# Patient Record
Sex: Male | Born: 1960 | Race: White | Hispanic: No | Marital: Married | Smoking: Former smoker
Health system: Southern US, Community
[De-identification: ages and names within clinical notes are randomized; demographics above are authoritative.]

## PROBLEM LIST (undated history)

## (undated) DIAGNOSIS — I639 Cerebral infarction, unspecified: Secondary | ICD-10-CM

## (undated) DIAGNOSIS — M3214 Glomerular disease in systemic lupus erythematosus: Secondary | ICD-10-CM

## (undated) DIAGNOSIS — F1020 Alcohol dependence, uncomplicated: Secondary | ICD-10-CM

## (undated) DIAGNOSIS — N189 Chronic kidney disease, unspecified: Secondary | ICD-10-CM

## (undated) DIAGNOSIS — E785 Hyperlipidemia, unspecified: Secondary | ICD-10-CM

## (undated) DIAGNOSIS — I10 Essential (primary) hypertension: Secondary | ICD-10-CM

## (undated) DIAGNOSIS — I82409 Acute embolism and thrombosis of unspecified deep veins of unspecified lower extremity: Secondary | ICD-10-CM

## (undated) HISTORY — DX: Glomerular disease in systemic lupus erythematosus: M32.14

## (undated) HISTORY — DX: Essential (primary) hypertension: I10

## (undated) HISTORY — PX: TOTAL HIP ARTHROPLASTY: SHX124

## (undated) HISTORY — DX: Hyperlipidemia, unspecified: E78.5

## (undated) HISTORY — DX: Cerebral infarction, unspecified: I63.9

## (undated) HISTORY — DX: Acute embolism and thrombosis of unspecified deep veins of unspecified lower extremity: I82.409

## (undated) HISTORY — DX: Alcohol dependence, uncomplicated: F10.20

## (undated) HISTORY — PX: OSTEOCHONDROMA EXCISION: SHX2137

## (undated) HISTORY — PX: RENAL BIOPSY: SHX156

## (undated) HISTORY — DX: Chronic kidney disease, unspecified: N18.9

---

## 2005-05-27 ENCOUNTER — Inpatient Hospital Stay (HOSPITAL_COMMUNITY): Admission: RE | Admit: 2005-05-27 | Discharge: 2005-05-29 | Payer: Self-pay | Admitting: Psychiatry

## 2005-05-28 ENCOUNTER — Ambulatory Visit: Payer: Self-pay | Admitting: Psychiatry

## 2014-12-14 ENCOUNTER — Other Ambulatory Visit: Payer: Self-pay | Admitting: Nephrology

## 2014-12-14 DIAGNOSIS — I639 Cerebral infarction, unspecified: Secondary | ICD-10-CM

## 2014-12-19 ENCOUNTER — Ambulatory Visit
Admission: RE | Admit: 2014-12-19 | Discharge: 2014-12-19 | Disposition: A | Discharge status: Home / Self Care | Payer: 59 | Source: Ambulatory Visit | Attending: Nephrology | Admitting: Nephrology

## 2014-12-19 DIAGNOSIS — I639 Cerebral infarction, unspecified: Secondary | ICD-10-CM

## 2014-12-26 ENCOUNTER — Ambulatory Visit: Payer: 59 | Admitting: Neurology

## 2015-03-21 ENCOUNTER — Ambulatory Visit (INDEPENDENT_AMBULATORY_CARE_PROVIDER_SITE_OTHER): Payer: 59 | Admitting: Sports Medicine

## 2015-03-21 ENCOUNTER — Encounter: Payer: Self-pay | Admitting: Sports Medicine

## 2015-03-21 VITALS — BP 125/82 | HR 89 | Resp 16

## 2015-03-21 DIAGNOSIS — M79674 Pain in right toe(s): Secondary | ICD-10-CM | POA: Diagnosis not present

## 2015-03-21 DIAGNOSIS — L600 Ingrowing nail: Secondary | ICD-10-CM

## 2015-03-21 NOTE — Progress Notes (Deleted)
   Subjective:    Patient ID: Russell Graham, male    DOB: [birth year], 54 y.o.   MRN: [id number]  HPI    Review of Systems  Constitutional: Positive for fatigue.  Cardiovascular:       Blood clots legs /lungs High / low BP  HX og heart attack/ stroke : June 2016   Genitourinary:       Kidney disease  Lupus   Musculoskeletal:       Swelling of legs and feet  Neurological:       Memory loss  Hematological:       Blood clotting disorder  Phlebitis   All other systems reviewed and are negative.      Objective:   Physical Exam        Assessment & Plan:

## 2015-03-21 NOTE — Progress Notes (Signed)
Patient ID: Russell Graham, male   DOB: 02-Nov-1960, 54 y.o.   MRN: [id number]  HPI: Russell Graham is a 54 y.o.  male patient who presents to office today complaining of a painful incurvated, red, hot, swollen medial nail border of the big toe on the right foot. This has been present for many months; multiple attempts at cutting it out and soaking with reocurence.  Patient denies fever/chills/nausea/vomitting/any other related constitutional symptoms at this time.  Patient has a history significant for: lupus, HTN, HLD, CVA 4 months ago with residual muscle fatigue in legs.   No current outpatient prescriptions on file prior to visit.   No current facility-administered medications on file prior to visit.   Allergies  Allergen Reactions  . Naproxen Hives    Review of Systems  Constitutional: Positive for fatigue.  Cardiovascular:       Blood clots legs /lungs High / low BP  HX og heart attack/ stroke : June 2016   Genitourinary:       Kidney disease  Lupus   Musculoskeletal:       Swelling of legs and feet  Neurological:       Memory loss  Hematological:       Blood clotting disorder  Phlebitis   All other systems reviewed and are negative.  Objective:  Vitals: Reviewed  General: Well developed, nourished, in no acute distress, alert and oriented x3   Dermatology: Skin is warm, dry and supple bilateral. Right hallux medial border of nail appears to be  severely incurvated with hyperkeratosis formation at the distal aspects of  the medial and lateral nail border. (+) Erythema. (+) Edema. (-) serosanguous  drainage present. The remaining nails appear unremarkable at this time. There are no open sores, lesions or other signs of infection present.  Vascular: Dorsalis Pedis artery and Posterior Tibial artery pedal pulses are 2/4 bilateral with immedate capillary fill time. Scant Pedal hair growth present. No lower extremity edema.   Neruologic: Grossly intact via light touch  bilateral.  Musculoskeletal: Tenderness to palpation of the Right hallux medial nail fold. Muscular strength within normal limits in all groups bilateral.   Assesement and Plan: Problem List Items Addressed This Visit    None    Visit Diagnoses    Nail, ingrown    -  Primary    right hallux medial border    Pain in toe of right foot          -Discussed treatment alternatives and plan of care; Explained permanent/temporary nail avulsion and post procedure course to patient. Made patient aware that because of lupus and use of immunosuppressants may influence healing after procedure; patient expressed understanding and desires permanent removal. - After a verbal consent for permanent right hallux medial border avulsion, injected 3 ml of a 50:50 mixture of 1% plain lidocaine and 0.25% plain marcaine in a normal hallux block fashion. Next, a  betadine prep was performed. Anesthesia was tested and found to be appropriate.  The offending right hallux medial nail border was then incised from the hyponychium to the epinychium. The offending nail border was removed and cleared from the field. The area was curretted for any remaining nail or spicules. Phenol application performed and the area was then flushed with alcohol and dressed with silvadene cream and a dry sterile dressing. -Patient was instructed to leave the dressing intact for today and begin soaking  in a weak solution of betadine and water tomorrow. Patient was instructed to  soak for 15-20 minutes each day and apply neosporin and a gauze or bandaid dressing each day. -Patient was instrcuted to monitor the toe for signs of infection and return to office if toe becomes red, hot or swollen. -Patient is to return in 1 week for follow up care or sooner if problems arise.  Asencion Islam, DPM

## 2015-03-21 NOTE — Patient Instructions (Signed)

## 2015-03-28 ENCOUNTER — Ambulatory Visit: Payer: 59 | Admitting: Sports Medicine

## 2015-04-03 ENCOUNTER — Ambulatory Visit (INDEPENDENT_AMBULATORY_CARE_PROVIDER_SITE_OTHER): Payer: 59 | Admitting: Sports Medicine

## 2015-04-03 ENCOUNTER — Encounter: Payer: Self-pay | Admitting: Sports Medicine

## 2015-04-03 VITALS — BP 125/83 | HR 90 | Resp 14

## 2015-04-03 DIAGNOSIS — L600 Ingrowing nail: Secondary | ICD-10-CM

## 2015-04-03 DIAGNOSIS — M79674 Pain in right toe(s): Secondary | ICD-10-CM

## 2015-04-03 NOTE — Progress Notes (Signed)
Patient ID: Russell Graham, male   DOB: May 11, 1961, 54 y.o.   MRN: [id number] Subjective: Russell SermonsJuan M Graham is a 54 y.o.  male patient returns to office today for follow up evaluation after having Right Hallux medial permanent nail avulsion performed on (03/21/15). Patient has been soaking using betadine and applying topical antibiotic covered with bandaid daily. Patient denies fever/chills/nausea/vomitting/any other related constitutional symptoms at this time.  Objective:  Vitals: Reviewed  General: Well developed, nourished, in no acute distress, alert and oriented x3   Dermatology: Skin is warm, dry and supple bilateral. Right hallux medial nail bed appears to be clean, dry, with mild granular tissue and surrounding eschar/scab. (+) Mild focal Erythema. (-) Edema. (-) serosanguous drainage present. The remaining nails appear unremarkable at this time. There are no other lesions or other signs of infection  present.  Neurovascular status: Intact. No lower extremity swelling; No pain with calf compression bilateral.  Musculoskeletal: Decreased tenderness to palpation of the Right Hallux medial nail fold. Muscular strength within normal limits bilateral.   Assesement and Plan: Problem List Items Addressed This Visit    None    Visit Diagnoses    Nail, ingrown    -  Primary    Status post PNA Right Hallux Medial Nail border (03/21/15), healing well    Pain in toe of right foot          -Examined patient  -Cleansed right hallux medial nail fold and gently scrubbed with peroxide ridding away eschar at site and applied triple antibiotic covered with bandaid.  -Discussed plan of care with patient. -Patient to now begin soaking in a weak solution of Epsom salt and warm water. Patient was instructed to soak for 15-20 minutes each day until the toe appears normal and there is no drainage, redness, tenderness, or swelling at the procedure site, and apply neosporin and a gauze or bandaid dressing each day  as needed. May leave open to air at night. -Educated patient on long term care after nail surgery. -Patient was instructed to monitor the toe for reoccurrence and signs of infection; Patient advised to return to office if toe becomes red, hot or swollen. -Patient is to return in as needed or sooner if problems arise.  Asencion Islamitorya Maxson Oddo, DPM

## 2015-07-31 ENCOUNTER — Encounter: Payer: Self-pay | Admitting: Gastroenterology

## 2015-08-20 DIAGNOSIS — M329 Systemic lupus erythematosus, unspecified: Secondary | ICD-10-CM | POA: Diagnosis not present

## 2015-08-20 DIAGNOSIS — D696 Thrombocytopenia, unspecified: Secondary | ICD-10-CM | POA: Diagnosis not present

## 2015-09-19 ENCOUNTER — Telehealth: Payer: Self-pay | Admitting: Gastroenterology

## 2015-09-19 ENCOUNTER — Ambulatory Visit (INDEPENDENT_AMBULATORY_CARE_PROVIDER_SITE_OTHER): Payer: BC Managed Care – PPO | Admitting: Gastroenterology

## 2015-09-19 ENCOUNTER — Encounter: Payer: Self-pay | Admitting: Gastroenterology

## 2015-09-19 VITALS — BP 116/64 | HR 76 | Ht 70.0 in | Wt 188.2 lb

## 2015-09-19 DIAGNOSIS — F1020 Alcohol dependence, uncomplicated: Secondary | ICD-10-CM

## 2015-09-19 DIAGNOSIS — Z1211 Encounter for screening for malignant neoplasm of colon: Secondary | ICD-10-CM

## 2015-09-19 DIAGNOSIS — D696 Thrombocytopenia, unspecified: Secondary | ICD-10-CM

## 2015-09-19 DIAGNOSIS — M329 Systemic lupus erythematosus, unspecified: Secondary | ICD-10-CM | POA: Insufficient documentation

## 2015-09-19 DIAGNOSIS — Z79899 Other long term (current) drug therapy: Secondary | ICD-10-CM | POA: Diagnosis not present

## 2015-09-19 DIAGNOSIS — I639 Cerebral infarction, unspecified: Secondary | ICD-10-CM | POA: Insufficient documentation

## 2015-09-19 DIAGNOSIS — R0989 Other specified symptoms and signs involving the circulatory and respiratory systems: Secondary | ICD-10-CM

## 2015-09-19 DIAGNOSIS — R198 Other specified symptoms and signs involving the digestive system and abdomen: Secondary | ICD-10-CM | POA: Diagnosis not present

## 2015-09-19 DIAGNOSIS — R6889 Other general symptoms and signs: Secondary | ICD-10-CM

## 2015-09-19 NOTE — Progress Notes (Signed)
HPI :  55 y/o male seen in consultation for Dr. Alysia Penna for [facility] screening. 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 He has a history of CVAs - he reports 3 total, the first he thinks in December 2015, then again in June and in November 2016. He has not had any GI bleeding since being on this. He has had some bleeding at his gums in the past.   He thinks he has had a prior colonoscopy in the early 1990s. Nothing since that time. He denies any blood in the stools routinely. No constipation or diarrhea. No abdominal pain. Eating well. No weight loss. Trying to lose wieght with dietary changes. No FH of colon cancer. 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 He thinks his last drink was about a month ago, he has had a few relapses in recent months and attending AA again. He was noted to have thrombocytopenia on his last blood draw. He denies a history of liver disease or cirrhosis. He has a history of CKD with SLE.   He additionally reports a sense of needing to clear his throat frequently, he states this comes and goes over the past year. He denies any heartburn. He denies regurgitation. No dysphagia. He denies any postnasal drip.   Labs from 05/20/15 reviewed: WBC 4.3, Hgb 13.0, MCV 105, platelets 101 Cr 1.09  AST 28, ALT 28, AP 57, T bil 0.5    Past Medical History  Diagnosis Date  . CVA (cerebral infarction)   . Hypertension   . Hyperlipemia   . Lupus nephritis (HCC)   . DVT (deep venous thrombosis) (HCC)   . Chronic kidney disease   . Alcoholism [facility])      Past Surgical History  Procedure Laterality Date  . Total hip arthroplasty Bilateral   . Osteochondroma excision      right arm  . Renal biopsy     Family History  Problem Relation Age of Onset  . Heart disease Brother   . Breast cancer Sister   . Hypertension  Sister   . Colon cancer Neg Hx   . Stomach cancer Neg Hx   . Pancreatic cancer Neg Hx   . Esophageal cancer Neg Hx    Social History  Substance Use Topics  . Smoking status: Former Games developer  . Smokeless tobacco: Never Used  . Alcohol Use: No   Current Outpatient Prescriptions  Medication Sig Dispense Refill  . carvedilol (COREG) 6.25 MG tablet Take 6.25 mg by mouth 2 (two) times daily with a meal.    . ELIQUIS 5 MG TABS tablet TK 1 T PO BID  4  . hydroxychloroquine (PLAQUENIL) 200 MG tablet Take by mouth. Reported on 09/19/2015    . predniSONE (DELTASONE) 10 MG tablet Take 1 tablet every other day alternating with 0.5 tablet  3  . simvastatin (ZOCOR) 20 MG tablet   11   No current facility-administered medications for this visit.   Allergies  Allergen Reactions  . Naproxen Hives     Review of Systems: All systems reviewed and negative except where noted in HPI.    Labs per HPI as above  Physical Exam: BP 116/64 mmHg  Pulse 76  Ht  (1.778 m)  Wt 188 lb 3.2 oz (85.367 kg)  BMI 27.00 kg/m2 Constitutional: Pleasant,well-developed, male in no  acute distress. HEENT: Normocephalic and atraumatic. Conjunctivae are normal. No scleral icterus. Neck supple.  Cardiovascular: Normal rate, regular rhythm.  Pulmonary/chest: Effort normal and breath sounds normal. No wheezing, rales or rhonchi. Abdominal: Soft, nondistended, nontender. No appreciable ascites. Bowel sounds active throughout. There are no masses palpable. No hepatomegaly. Extremities: no edema Lymphadenopathy: No cervical adenopathy noted. Neurological: Alert and oriented to person place and time. Skin: Skin is warm and dry. No rashes noted. Psychiatric: Normal mood and affect. Behavior is normal.   ASSESSMENT AND PLAN: 55 y/o male with a history of 3 CVAs since 2015, on Eliquis, with history of SLE, here for CRC screening. Also noted to have thrombocytopenia in the setting of a history of significant alcohol  use raising the possibility of underlying cirrhosis.   CRC screening - he is average risk without symptoms. I suspect his mild macrocytic anemia from December is related to alcohol use but have requested his more recent CBC. I discussed screening options with him to include optical colonoscopy and stool testing. Given his multiple CVAs and on Eliquis, he would be at higher risk for CVA recurrence if Eliquis is held. In this light, we will proceed with Cologuard testing. If negative, will repeat in 3 years. If positive, we will need to discuss his case with Neurology to see if he has clearance to hold Eliquis for colonoscopy.   Thrombocytopenia / Alcoholism - abstinent for one month per patient. Given his low platelets it raises the possibility of underlying cirrhosis. He had a repeat CBC last week for this issue and we will obtain the results. If thrombocytopenia persists I will recommend abdominal US to assess for cirrhotic changes. His LFTs were most recently normal and he will continue to work on abstinence, attending AA.  Throat clearing - does not seem consistent with reflux at this time. We could consider empiric PPI pending his course, but he will have this further evaluated by primary care, rule out postnasal drip, etc.   Ileene PatrickSteven Armbruster, MD [facility] Pager [phone]  CC: Alysia PennaHolwerda, Scott, MD

## 2015-09-19 NOTE — Patient Instructions (Signed)
We have sent your demographic and insurance information to [facility]. They should contact you within the next week regarding your Cologuard (colon cancer screening) test. If you have not heard from them within the next week, please call our office at [phone]. 

## 2015-09-19 NOTE — Telephone Encounter (Signed)
Russell KocherRegina this patient's recent labs were reviewed from 09/09/2015 - he had a CBC showing Hgb of 13.1, with MCV of 104 and platelets of 101. LFTS normal Given his history of alcohol use and chronic thrombocytopenia, I recommend a US abdomen to assess for cirrhosis.   Can you please help schedule him for this US? Thanks

## 2015-09-20 ENCOUNTER — Other Ambulatory Visit: Payer: Self-pay | Admitting: *Deleted

## 2015-09-20 DIAGNOSIS — D696 Thrombocytopenia, unspecified: Secondary | ICD-10-CM

## 2015-09-20 DIAGNOSIS — F1010 Alcohol abuse, uncomplicated: Secondary | ICD-10-CM

## 2015-09-20 NOTE — Telephone Encounter (Signed)
Scheduled [facility] at [facility] on 09/26/15 at 11:00 AM. NPO after midnight. Left a message for patient to call back.

## 2015-09-20 NOTE — Telephone Encounter (Signed)
Patient notified of results and recommendations. Patient given appointment date, time and instructions.

## 2015-09-26 ENCOUNTER — Telehealth: Payer: Self-pay | Admitting: *Deleted

## 2015-09-26 ENCOUNTER — Other Ambulatory Visit: Payer: Self-pay | Admitting: *Deleted

## 2015-09-26 ENCOUNTER — Ambulatory Visit (HOSPITAL_COMMUNITY)
Admission: RE | Admit: 2015-09-26 | Discharge: 2015-09-26 | Disposition: A | Discharge status: Home / Self Care | Payer: BC Managed Care – PPO | Source: Ambulatory Visit | Attending: Gastroenterology | Admitting: Gastroenterology

## 2015-09-26 DIAGNOSIS — F1010 Alcohol abuse, uncomplicated: Secondary | ICD-10-CM | POA: Diagnosis present

## 2015-09-26 DIAGNOSIS — D696 Thrombocytopenia, unspecified: Secondary | ICD-10-CM | POA: Diagnosis present

## 2015-09-26 DIAGNOSIS — R932 Abnormal findings on diagnostic imaging of liver and biliary tract: Secondary | ICD-10-CM

## 2015-09-26 NOTE — Telephone Encounter (Signed)
Received a call from [facility]ology that patient had an ultrasound today. Has possible liver mass and needs to have an MRI.

## 2015-09-26 NOTE — Telephone Encounter (Signed)
Thanks for letting me know, I just reviewed it now. Please tell him there is a small abnormality on the US - they are not sure if this is a mass like lesion or just an area of fatty sparing. To clarify what it is they are recommending an MRI of the liver with and without contrast. Can you help him to coordinate this as soon as possible. This will be a more definitive study. If he has additional questions please let me know. Thanks

## 2015-09-26 NOTE — Telephone Encounter (Signed)
Scheduled MRI of liver at [facility] on 10/04/15 at 4:00 PM. NPO 6 hours prior.(10:00 AM) Patient given results and recommendations. He was given appointment date, time and instructions.

## 2015-10-04 ENCOUNTER — Other Ambulatory Visit: Payer: Self-pay | Admitting: Gastroenterology

## 2015-10-04 ENCOUNTER — Telehealth: Payer: Self-pay | Admitting: *Deleted

## 2015-10-04 ENCOUNTER — Ambulatory Visit (HOSPITAL_COMMUNITY)
Admission: RE | Admit: 2015-10-04 | Discharge: 2015-10-04 | Disposition: A | Discharge status: Home / Self Care | Payer: BC Managed Care – PPO | Source: Ambulatory Visit | Attending: Gastroenterology | Admitting: Gastroenterology

## 2015-10-04 DIAGNOSIS — R9389 Abnormal findings on diagnostic imaging of other specified body structures: Secondary | ICD-10-CM

## 2015-10-04 DIAGNOSIS — R932 Abnormal findings on diagnostic imaging of liver and biliary tract: Secondary | ICD-10-CM

## 2015-10-04 NOTE — Telephone Encounter (Signed)
Patient was unable to complete MRI due to claustrophobia. He will do it if it is a bigger MRI machine and with something to calm down. [facility]. Please, advise on medication for nerves.

## 2015-10-06 NOTE — Telephone Encounter (Signed)
We can try an open MRI if that is a possibility, if not, we can give him some valium 5mg  po x 1 prior to the MRI. Thanks for coordinating

## 2015-10-07 MED ORDER — DIAZEPAM 5 MG PO TABS: ORAL_TABLET | ORAL | Status: AC

## 2015-10-07 NOTE — Telephone Encounter (Signed)
Spoke with [facility] Inetta Fermo(Tina) and scheduled patient on 10/09/15 at 9:15 AM with 8:45 AM arrival. [address] [place], [place] [address]. Faxed order to [phone]

## 2015-10-07 NOTE — Telephone Encounter (Signed)
Rx called to pharmacy for Valium. Patient notified of appointment date, time and instructions.

## 2015-10-15 ENCOUNTER — Encounter: Payer: Self-pay | Admitting: Gastroenterology

## 2015-10-15 ENCOUNTER — Other Ambulatory Visit: Payer: Self-pay

## 2015-10-15 LAB — COLOGUARD: COLOGUARD: NEGATIVE

## 2015-10-16 ENCOUNTER — Telehealth: Payer: Self-pay | Admitting: Gastroenterology

## 2015-10-16 NOTE — Telephone Encounter (Signed)
Rene KocherRegina can you please notify this patient that his MRI liver from 10/09/15 does NOT show any liver mass. The liver appears normal without mass lesions or cirrhotic changes. He has some benign appearing renal cysts, one is hemorrhagic but appears benign. The liver and organs otherwise appear unremarkable, I don't see any evidence of cirrhosis. His primary care can follow up the kidney cyst but these appear benign and likely no further evaluation is needed.  This report will be scanned into Epic

## 2015-10-17 NOTE — Telephone Encounter (Signed)
Left a message for patient to call back for results

## 2015-10-17 NOTE — Telephone Encounter (Signed)
Patient given results and recommendations. 

## 2016-04-09 ENCOUNTER — Ambulatory Visit
Admission: RE | Admit: 2016-04-09 | Discharge: 2016-04-09 | Disposition: A | Discharge status: Home / Self Care | Payer: BC Managed Care – PPO | Source: Ambulatory Visit | Attending: Physician Assistant | Admitting: Physician Assistant

## 2016-04-09 ENCOUNTER — Other Ambulatory Visit: Payer: Self-pay | Admitting: Physician Assistant

## 2016-04-09 DIAGNOSIS — R531 Weakness: Secondary | ICD-10-CM

## 2016-09-22 IMAGING — US US ABDOMEN COMPLETE
1 series · 13 of 25 positions shown · non-contrast
Comparison: None.

CLINICAL DATA: Patient with history of alcohol abuse. Chronic
thrombocytopenia.

EXAM:
ABDOMEN ULTRASOUND COMPLETE

[Series 1: us abdomen complete · 0.23mm/px · 13 of 151 slices shown]
[im 1/151]
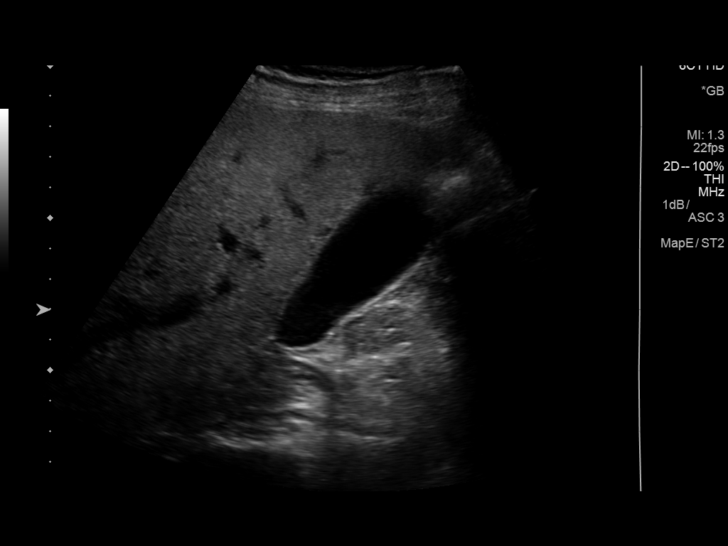
[im 13/151]
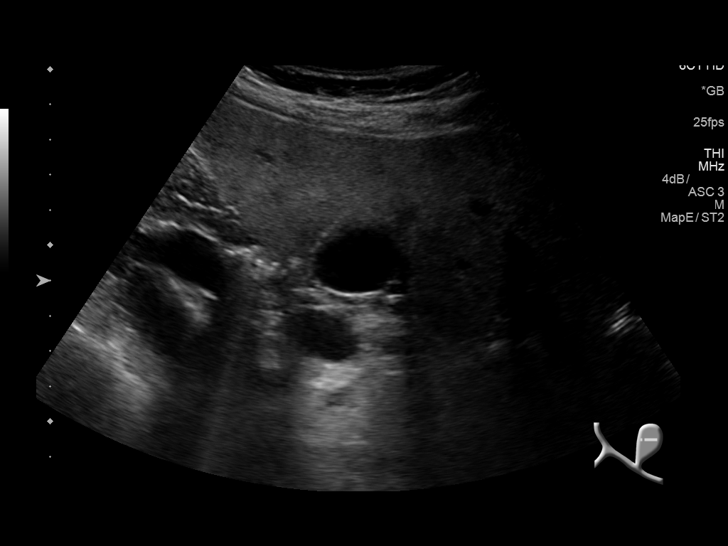
[im 26/151]
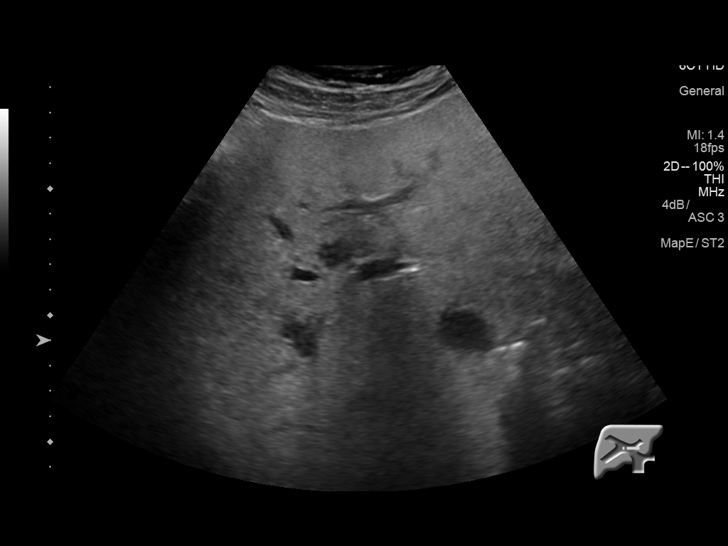
[im 38/151]
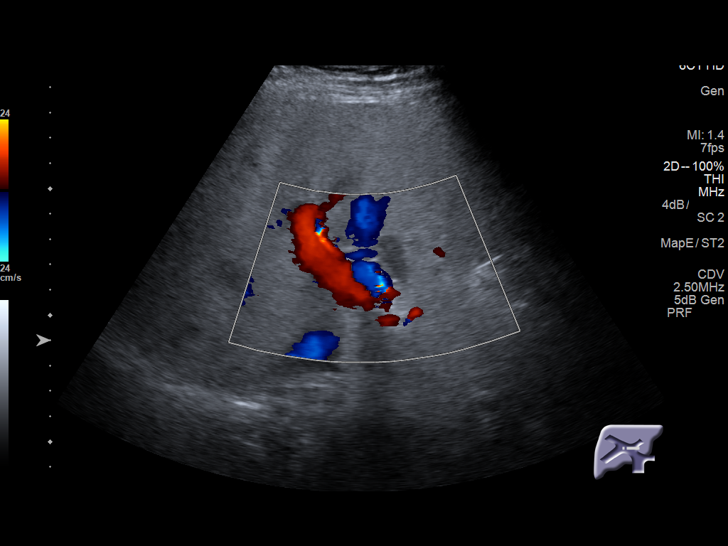
[im 51/151]
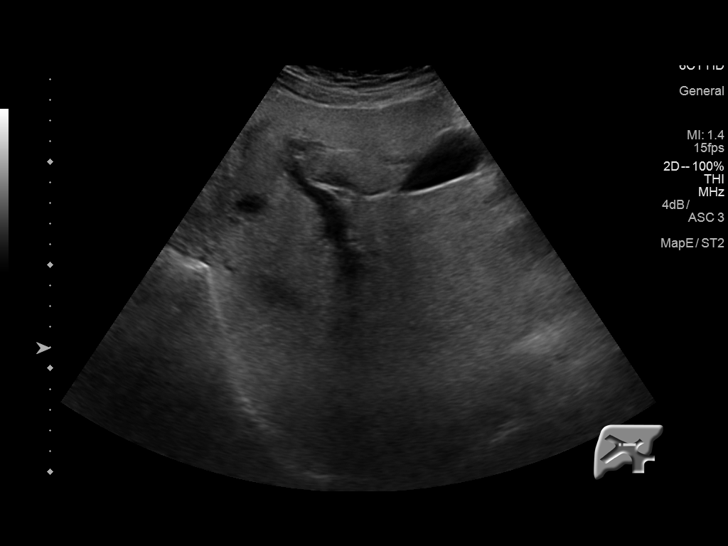
[im 63/151]
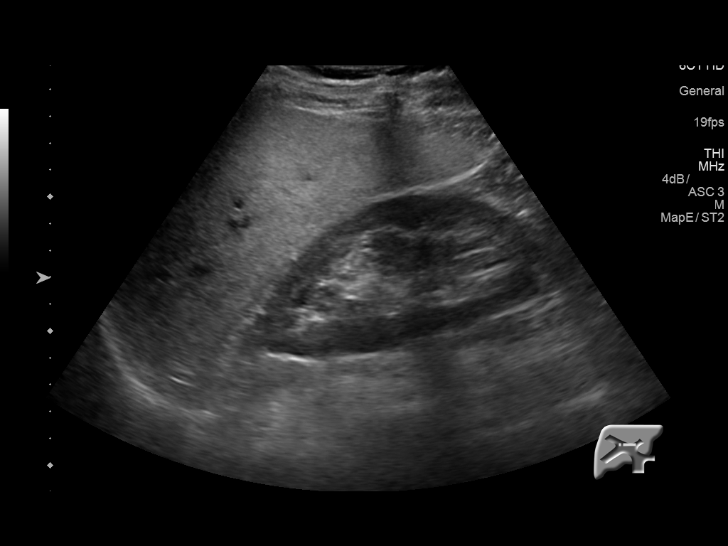
[im 76/151]
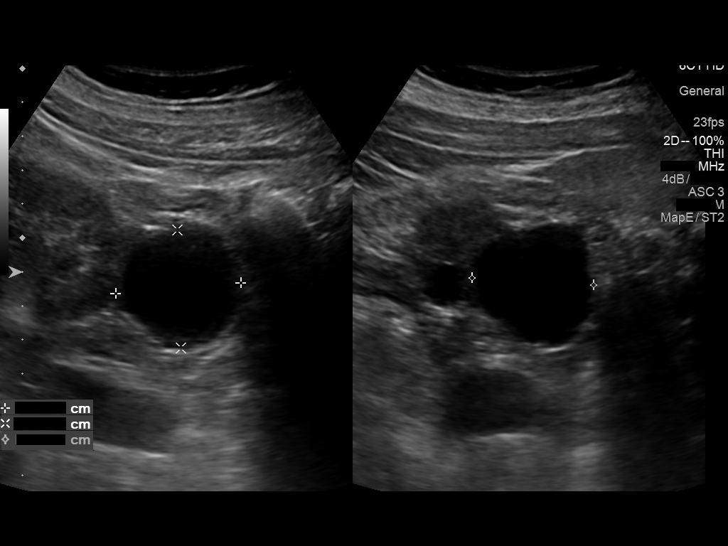
[im 88/151]
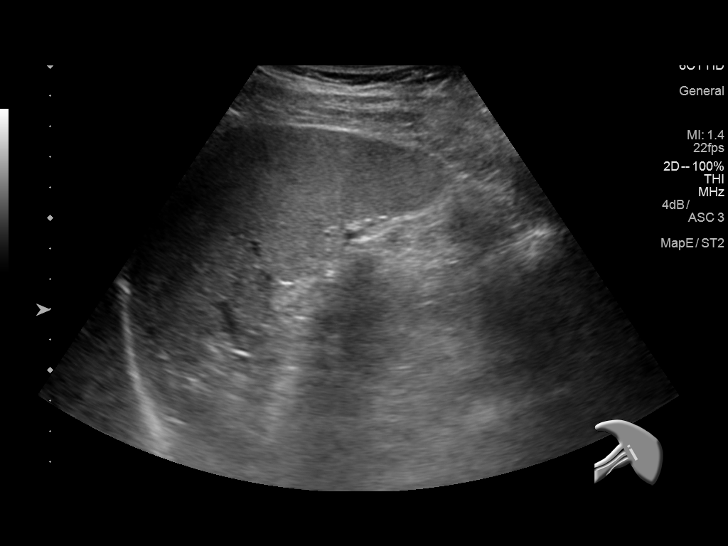
[im 101/151]
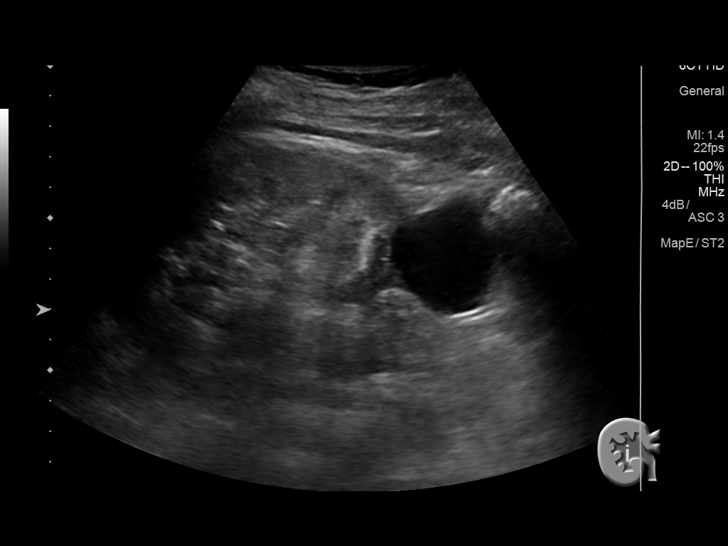
[im 113/151]
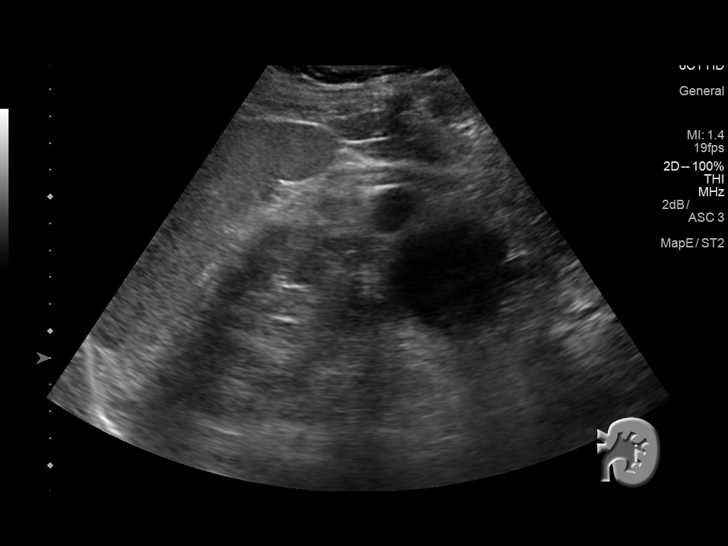
[im 126/151]
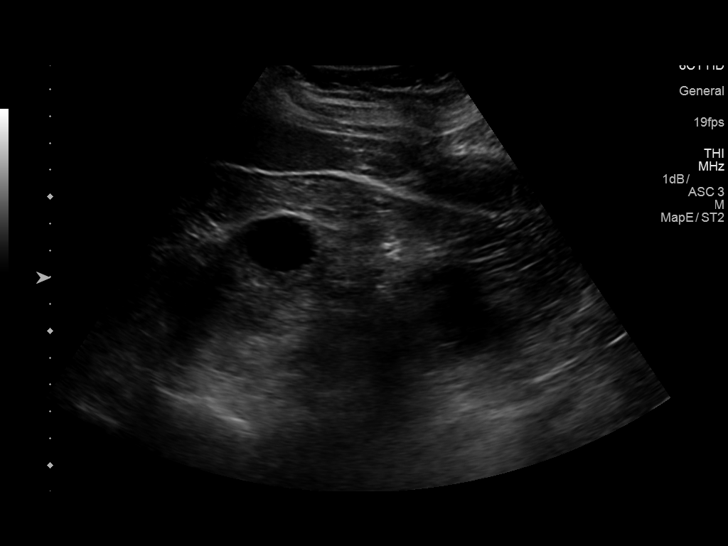
[im 138/151]
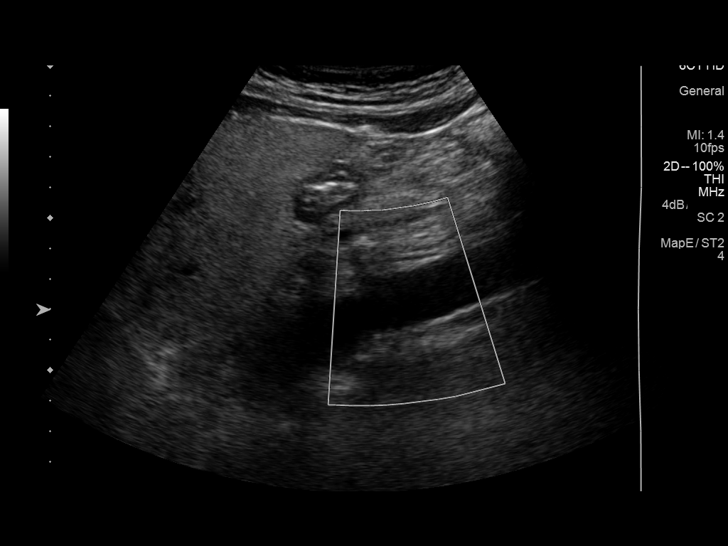
[im 151/151]
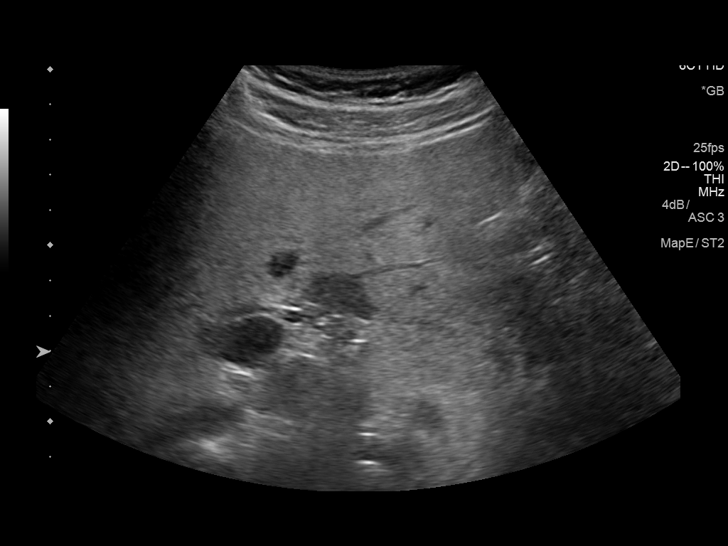

[13 of 25 positions shown; findings below may reference images not displayed]

FINDINGS: Gallbladder: No gallstones or wall thickening visualized. No
sonographic Murphy sign noted by sonographer.

Common bile duct: Diameter: 2 mm

Liver: The liver is diffusely increased in echogenicity. Adjacent to
the main portal vein there is a 2.6 x 1.0 x 2.2 cm masslike area of
decreased echogenicity.

IVC: No abnormality visualized.

Pancreas: Visualized portion unremarkable.

Spleen: Size and appearance within normal limits.

Right Kidney: Length: 12.5 cm. Normal renal cortical thickness and
echogenicity. No hydronephrosis. Multiple renal cysts are
demonstrated.

Left Kidney: Length: 11.2 cm. Normal renal cortical thickness and
echogenicity. No hydronephrosis. Multiple renal cysts are
demonstrated.

Abdominal aorta: No aneurysm visualized.

Other findings: None.
IMPRESSION: Increased hepatic parenchymal echogenicity as can be seen with
steatosis. There is a 2.6 cm focal area of hypo echogenicity within
the central liver which may represent a true hepatic mass or
potentially an area of focal fatty sparing. This needs dedicated
evaluation with pre and post contrast-enhanced abdominal MRI for
definitive characterization.

No cholelithiasis or sonographic evidence for acute cholecystitis.

These results will be called to the ordering clinician or
representative by the Radiologist Assistant, and communication
documented in the PACS or zVision Dashboard.

## 2017-03-25 ENCOUNTER — Other Ambulatory Visit: Payer: Self-pay | Admitting: Family Medicine

## 2017-03-25 DIAGNOSIS — R911 Solitary pulmonary nodule: Secondary | ICD-10-CM

## 2017-05-28 ENCOUNTER — Ambulatory Visit
Admission: RE | Admit: 2017-05-28 | Discharge: 2017-05-28 | Disposition: A | Discharge status: Home / Self Care | Payer: BC Managed Care – PPO | Source: Ambulatory Visit | Attending: Family Medicine | Admitting: Family Medicine

## 2017-05-28 DIAGNOSIS — R911 Solitary pulmonary nodule: Secondary | ICD-10-CM

## 2017-05-28 MED ORDER — IOPAMIDOL (ISOVUE-300) INJECTION 61%
75.0000 mL | Freq: Once | INTRAVENOUS | Status: AC | PRN
  Administered 2017-05-28: 75 mL via INTRAVENOUS

## 2017-12-31 ENCOUNTER — Ambulatory Visit
Admission: RE | Admit: 2017-12-31 | Discharge: 2017-12-31 | Disposition: A | Discharge status: Home / Self Care | Payer: BC Managed Care – PPO | Source: Ambulatory Visit | Attending: Family Medicine | Admitting: Family Medicine

## 2017-12-31 ENCOUNTER — Other Ambulatory Visit: Payer: Self-pay | Admitting: Family Medicine

## 2017-12-31 DIAGNOSIS — M5489 Other dorsalgia: Secondary | ICD-10-CM

## 2019-01-06 ENCOUNTER — Telehealth: Payer: Self-pay | Admitting: *Deleted

## 2019-01-06 NOTE — Telephone Encounter (Signed)
Per our records, patient is due for repeat cologuard testing. Still appropriate? Last office visit 09/2015. Please advise.

## 2019-01-06 NOTE — Telephone Encounter (Signed)
May be best to book for a follow up clinic visit for reassessment and discussion of options. Thanks

## 2019-01-09 IMAGING — CT CT CHEST W/ CM
2 of 3 series · 16 of 30 positions shown, 19 images · IV contrast (iopamidol)
Comparison: Comparison study from [facility] from [DATE]
mentioned in the history not available for review.

CLINICAL DATA: Follow-up of left upper lobe nodule.

EXAM:
CT CHEST WITH CONTRAST
TECHNIQUE: Multidetector CT imaging of the chest was performed during
intravenous contrast administration.
CONTRAST:  75mL OC8PP1-EFF IOPAMIDOL (OC8PP1-EFF) INJECTION 61%

[Series 3: chest with · axial · 0.77mm/px · z∈[-306,-19]mm · 11 of 139 slices shown, 14 images]
[im 12/139  mediastinal]
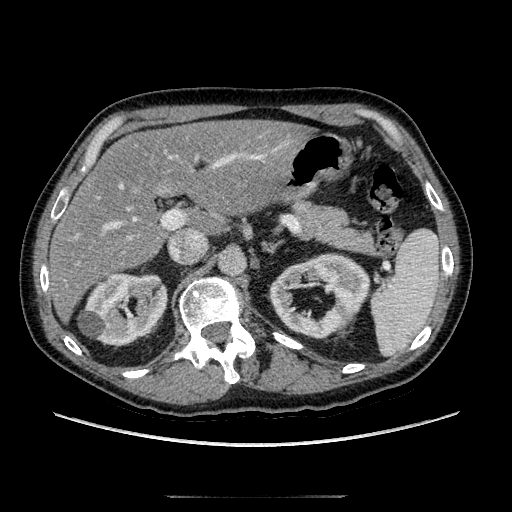
[im 12/139  lung]
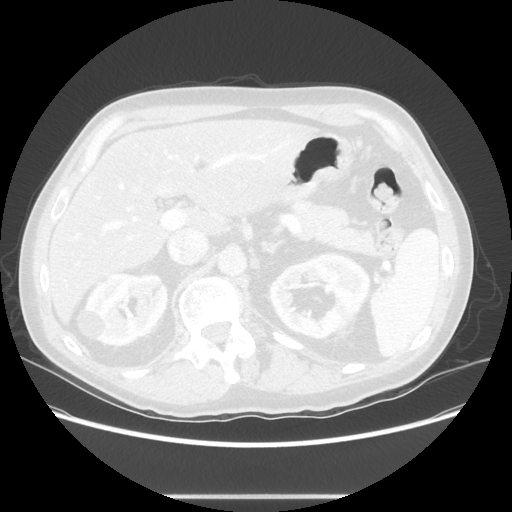
[im 24/139  lung]
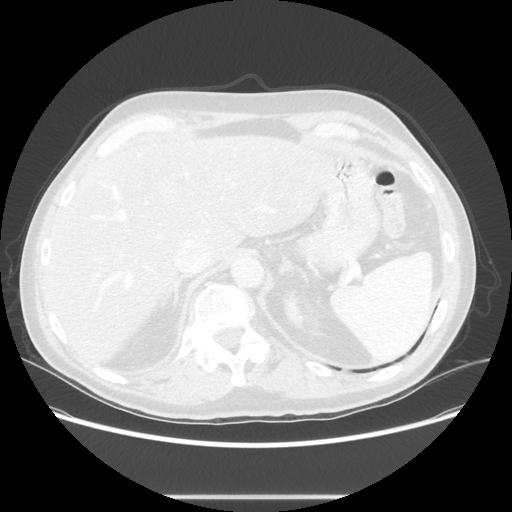
[im 35/139  lung]
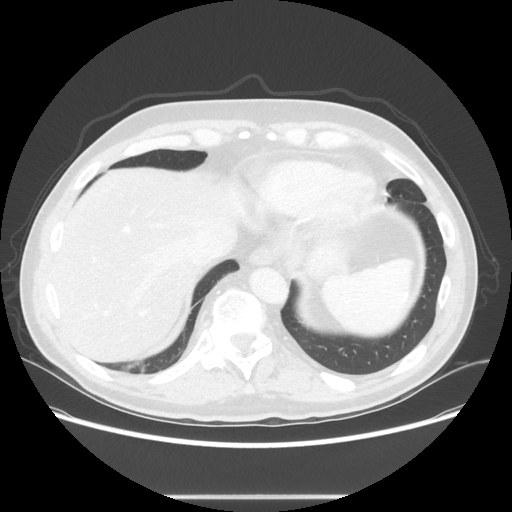
[im 47/139  lung]
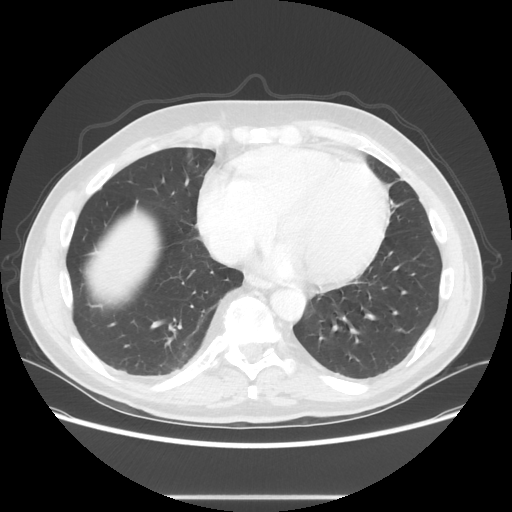
[im 58/139  mediastinal]
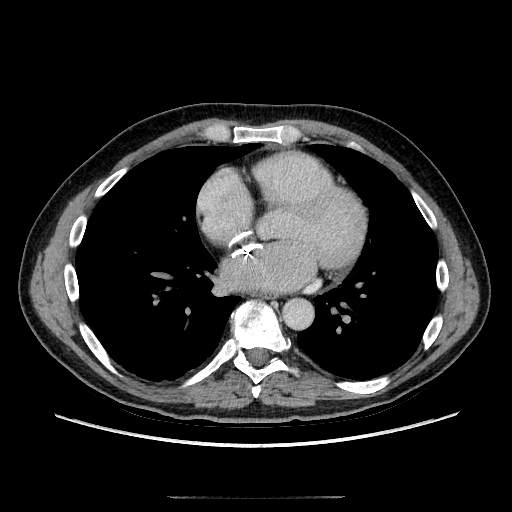
[im 58/139  lung]
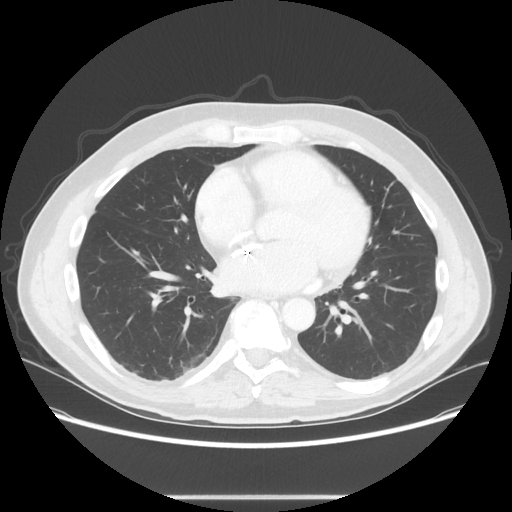
[im 70/139  lung]
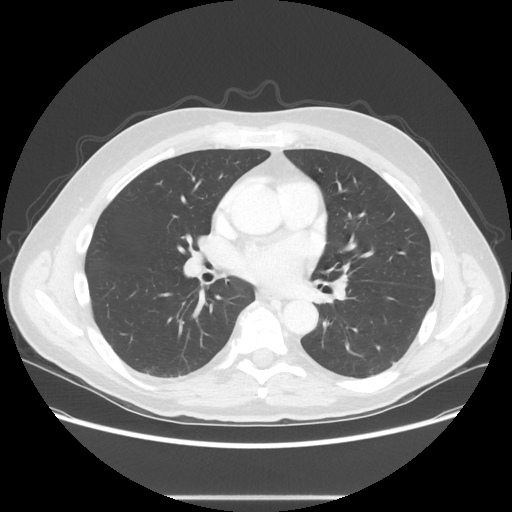
[im 81/139  lung]
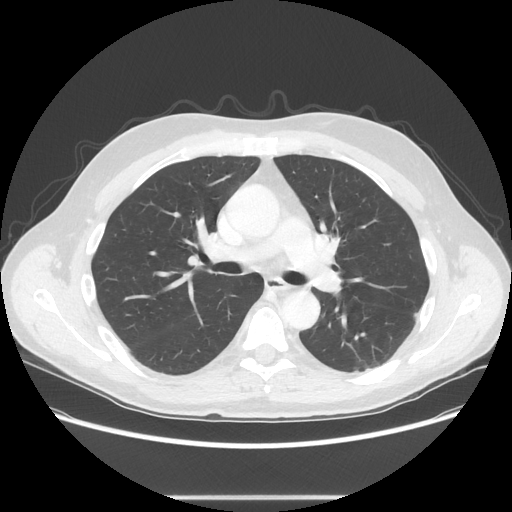
[im 93/139  lung]
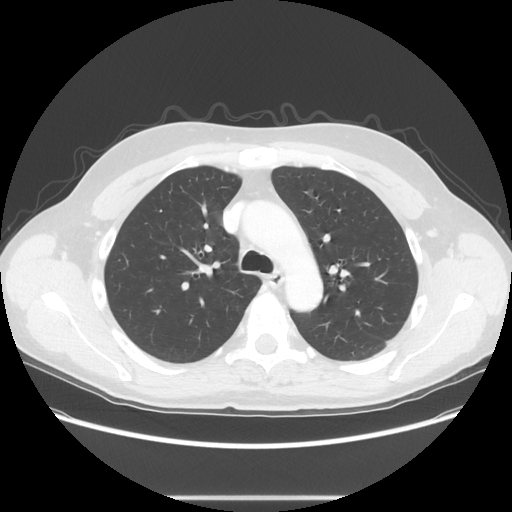
[im 104/139  mediastinal]
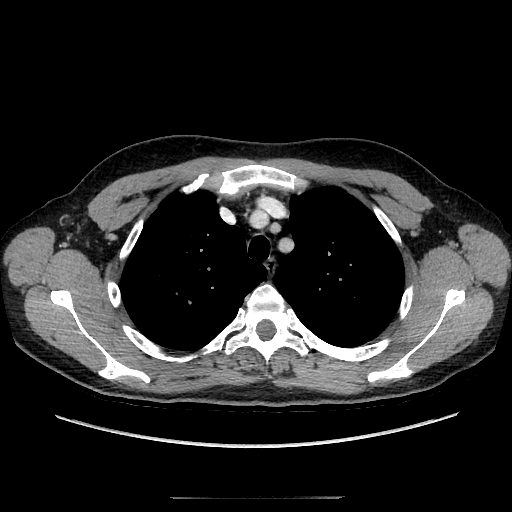
[im 104/139  lung]
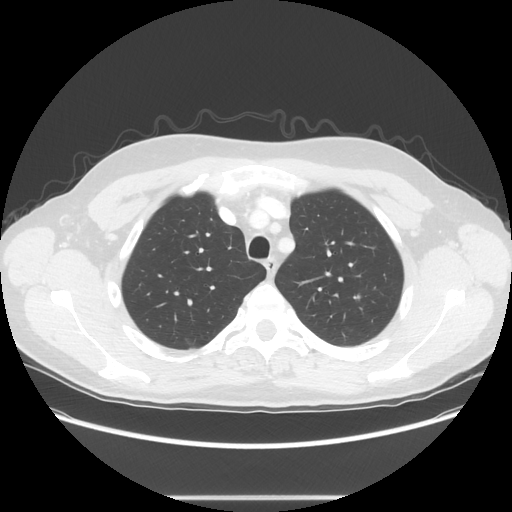
[im 116/139  lung]
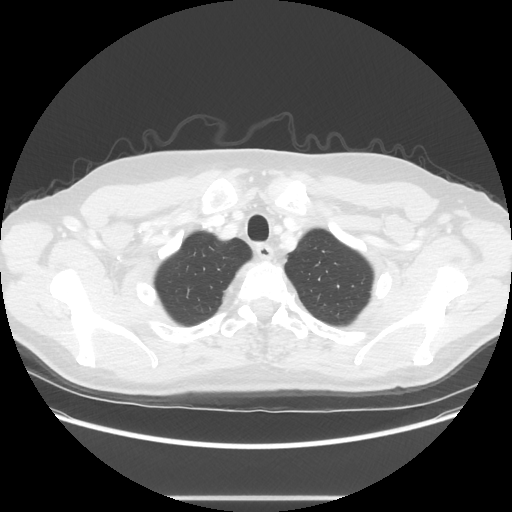
[im 127/139  lung]
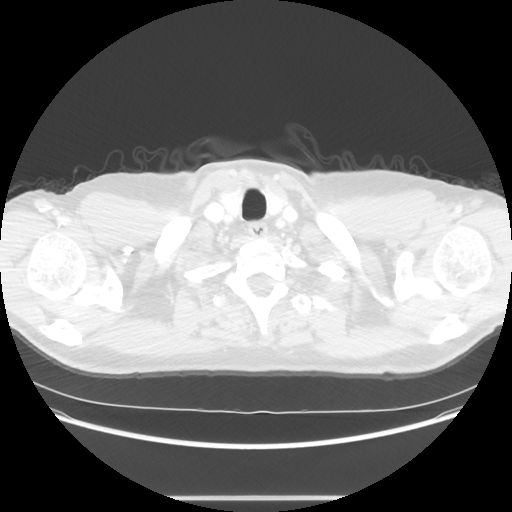

[Series 602: sagittal body · sagittal · 0.77mm/px · 5 of 159 slices shown]
[im 12/159  mediastinal]
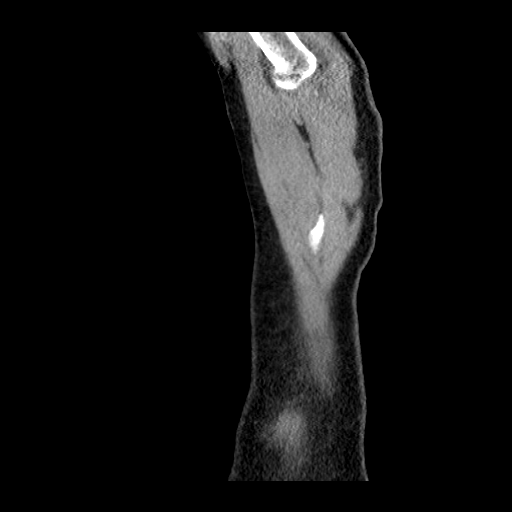
[im 34/159  mediastinal]
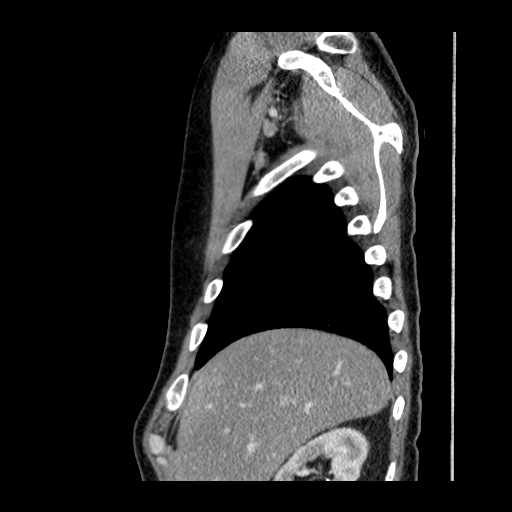
[im 57/159  mediastinal]
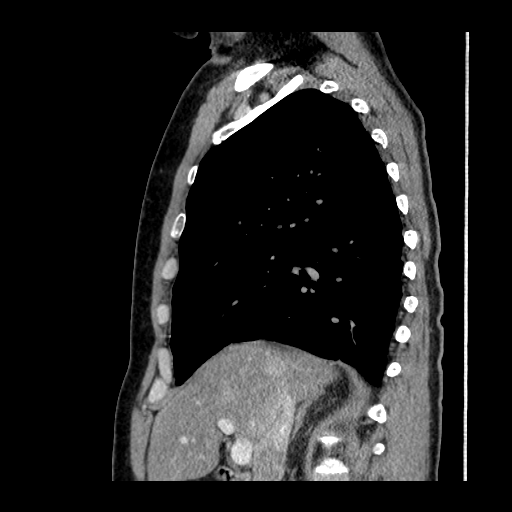
[im 68/159  mediastinal]
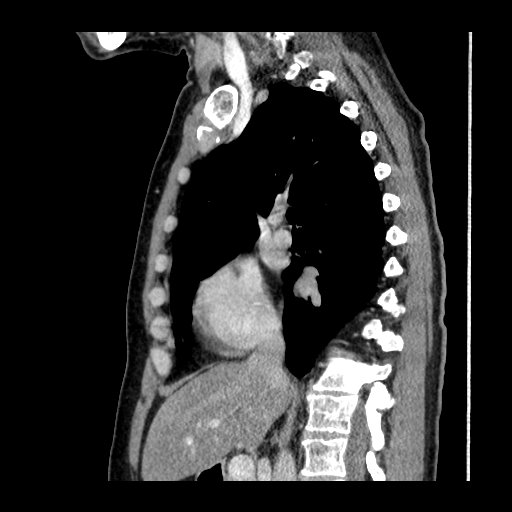
[im 91/159  mediastinal]
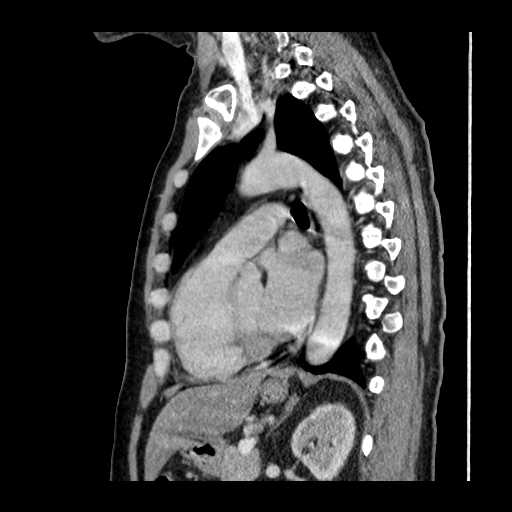

[16 of 30 positions shown; findings below may reference images not displayed]

FINDINGS: Cardiovascular: No significant vascular findings. Normal heart size.
No pericardial effusion. Atrial septum closure device noted. Mild
calcific atherosclerotic disease of the coronary arteries.

Mediastinum/Nodes: No enlarged mediastinal, hilar, or axillary lymph
nodes. Thyroid gland, trachea, and esophagus demonstrate no
significant findings.

Lungs/Pleura: Mild scarring versus atelectasis in the lingula. No
pulmonary nodules seen.

Upper Abdomen: Marked hepatic steatosis. Bilateral benign-appearing
renal cysts. Bilateral nephrolithiasis with the largest renal stone
in the midpole of the right kidney measuring 4.7 mm.

Musculoskeletal: Levoconvex scoliosis of the thoracic spine.
IMPRESSION: No pulmonary nodules seen.

Scarring versus atelectasis in the lingula.

Mild calcific atherosclerotic disease of the coronary arteries.

Marked hepatic steatosis.

Bilateral nephrolithiasis.  Bilateral benign-appearing renal cysts.

## 2019-01-09 NOTE — Telephone Encounter (Signed)
Thanks Dottie, if the FIT is negative then he can follow up with me in one year. If it's positive, he will need colonoscopy. Thanks!

## 2019-01-09 NOTE — Telephone Encounter (Signed)
I will follow with patient in about 1 week to see if test has returned and we will decide on plan at that point.

## 2019-01-09 NOTE — Telephone Encounter (Signed)
Patient indicates he had FIT testing completed with Dr Delene Ruffini office last week (I did confirm this with Dr Delene Ruffini office although I was unable to get the actual result from their office possibly due to it not being back). Would you still want him to come to office this year if negative or put it off 1 more year now?

## 2019-01-09 NOTE — Telephone Encounter (Signed)
Left message for patient to call back  

## 2019-01-26 NOTE — Telephone Encounter (Signed)
Per Dr Delene Ruffini office, FIT test was negative. Therefore, we will place a recall office visit reminder with Dr Havery Moros for 1 year for further recommendations at that time. I have spoken to patient to advise him of this information and he is in agreement.

## 2019-05-29 ENCOUNTER — Other Ambulatory Visit: Payer: Self-pay | Admitting: Family Medicine

## 2019-05-29 ENCOUNTER — Ambulatory Visit
Admission: RE | Admit: 2019-05-29 | Discharge: 2019-05-29 | Disposition: A | Discharge status: Home / Self Care | Payer: BC Managed Care – PPO | Source: Ambulatory Visit | Attending: Family Medicine | Admitting: Family Medicine

## 2019-05-29 DIAGNOSIS — R52 Pain, unspecified: Secondary | ICD-10-CM

## 2020-12-19 ENCOUNTER — Other Ambulatory Visit: Payer: Self-pay

## 2020-12-19 ENCOUNTER — Other Ambulatory Visit: Payer: Self-pay | Admitting: Family Medicine

## 2020-12-19 DIAGNOSIS — R454 Irritability and anger: Secondary | ICD-10-CM

## 2020-12-29 ENCOUNTER — Ambulatory Visit
Admission: RE | Admit: 2020-12-29 | Discharge: 2020-12-29 | Disposition: A | Discharge status: Home / Self Care | Payer: BC Managed Care – PPO | Source: Ambulatory Visit | Attending: Family Medicine | Admitting: Family Medicine

## 2020-12-29 DIAGNOSIS — R454 Irritability and anger: Secondary | ICD-10-CM

## 2020-12-29 MED ORDER — GADOBENATE DIMEGLUMINE 529 MG/ML IV SOLN
13.0000 mL | Freq: Once | INTRAVENOUS | Status: AC | PRN
  Administered 2020-12-29: 13 mL via INTRAVENOUS

## 2021-01-08 IMAGING — CR DG RIBS W/ CHEST 3+V*R*
3 series · 3 of 3 positions shown · non-contrast
Comparison: Chest CT May 29, 2017; chest radiograph April 21, 2015

CLINICAL DATA: Chest pain

EXAM:
RIGHT RIBS AND CHEST - 3+ VIEW

[w chest pa]
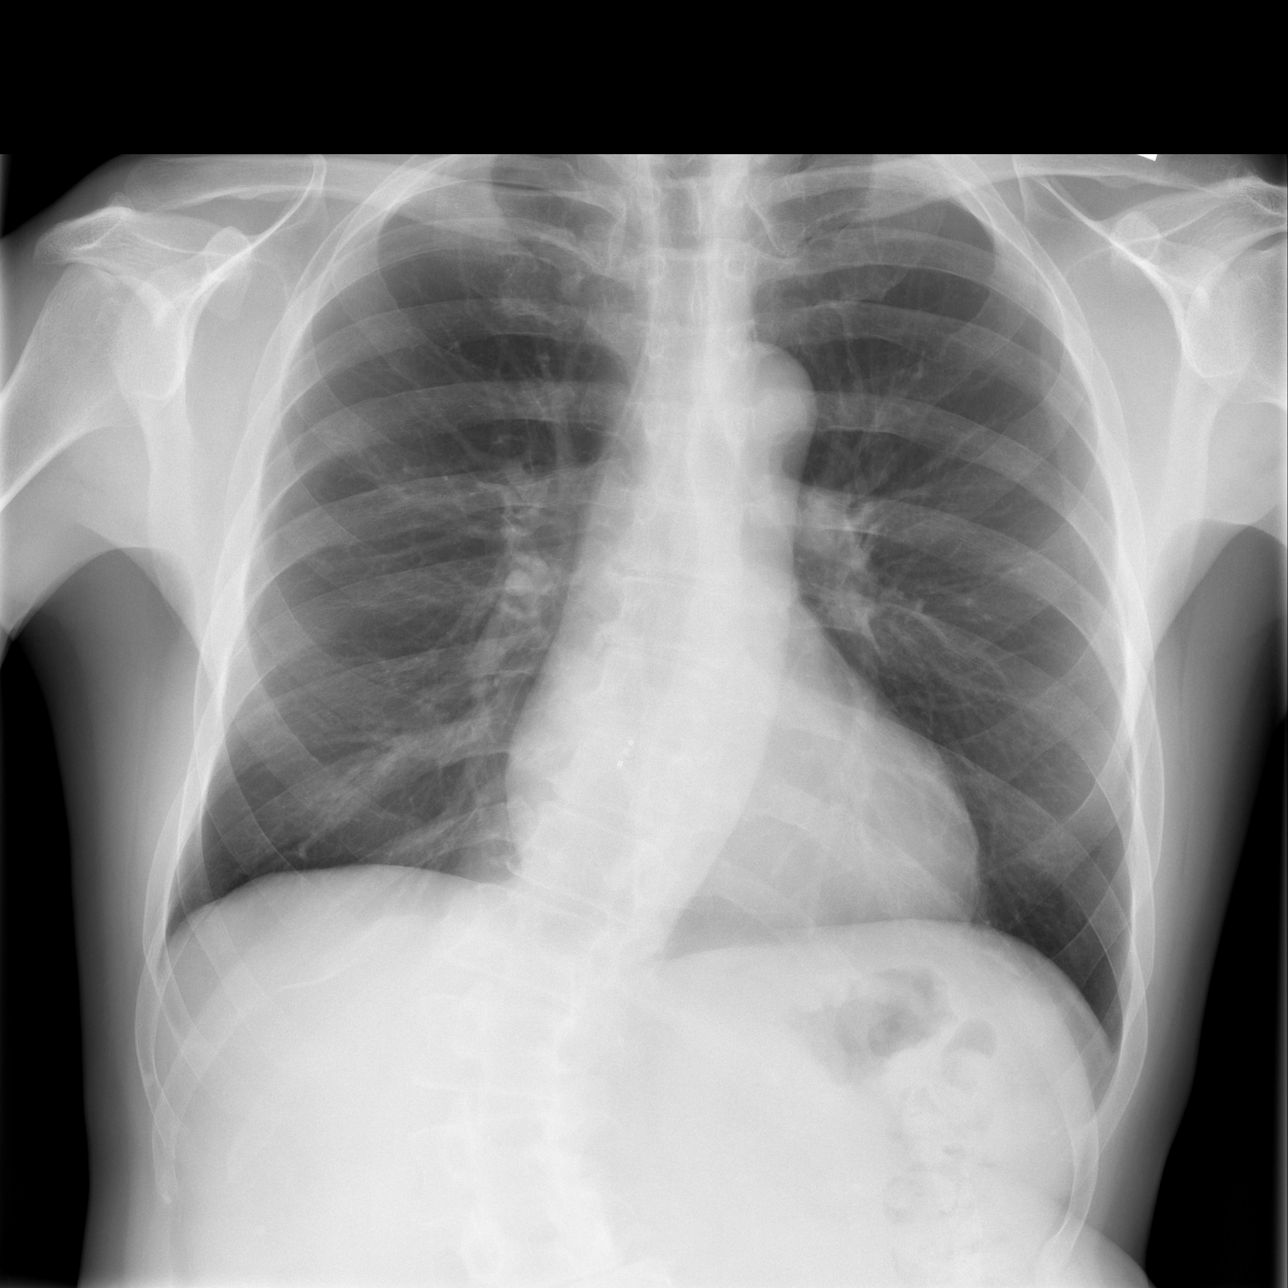

[w ribs ap/pa upper right *]
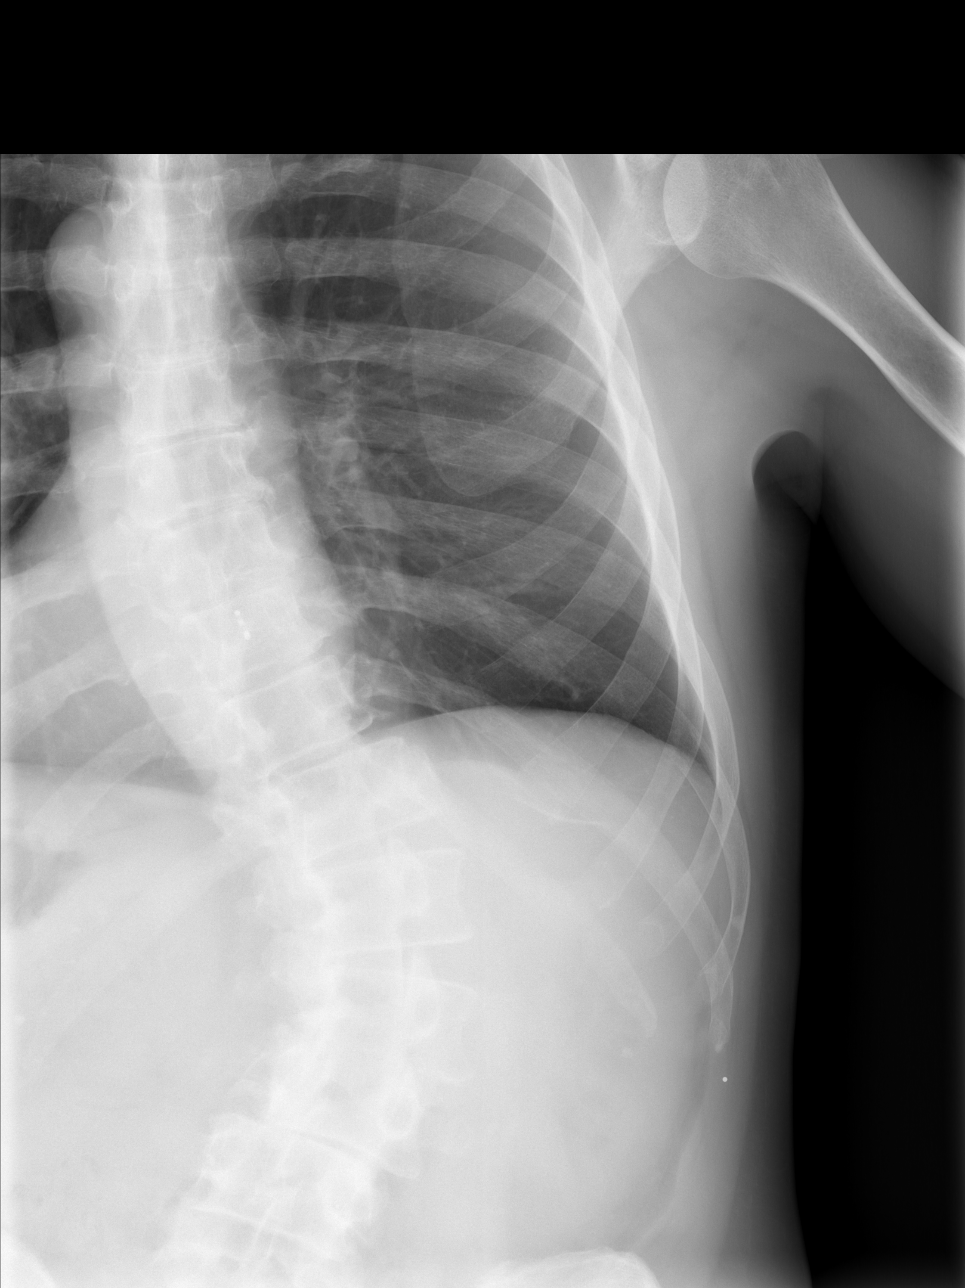

[w ribs oblique right *]
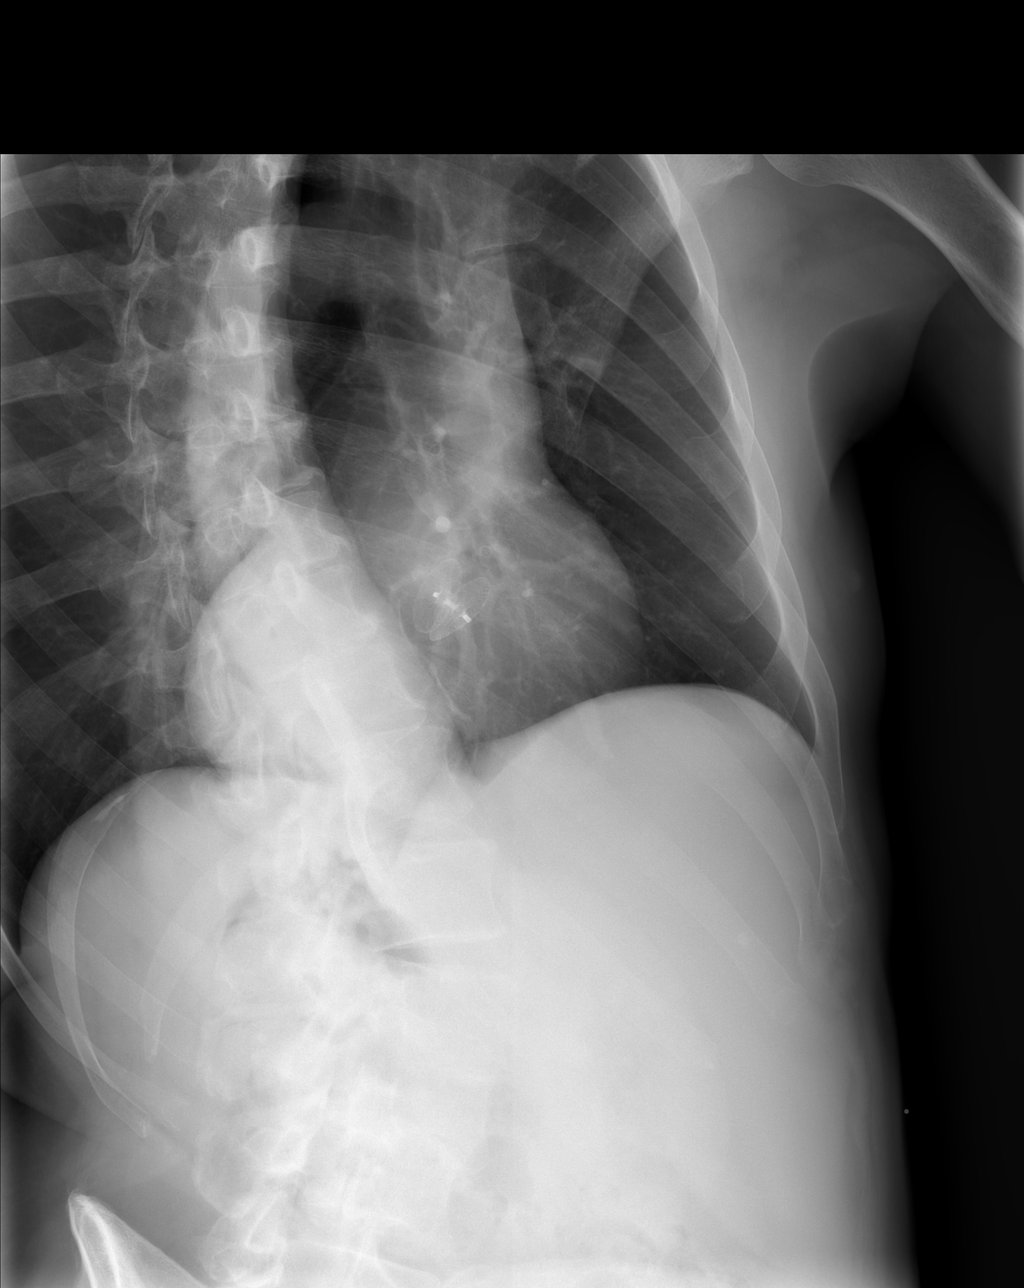

[3 of 3 positions shown; findings below may reference images not displayed]

FINDINGS: Frontal chest as well as oblique and cone-down rib images were
obtained. Lungs are clear. Heart size and pulmonary vascularity are
normal. Atrial septal defect closure device present. No adenopathy.
There is thoracolumbar dextroscoliosis with rotatory component. No
pneumothorax or pleural effusion. No evident fracture.
IMPRESSION: No evident rib fracture. Lungs clear. Stable cardiac silhouette with
atrial septal defect closure device present. Thoracolumbar scoliosis
noted.

## 2021-07-11 ENCOUNTER — Telehealth: Payer: Self-pay | Admitting: Radiation Oncology

## 2021-07-11 NOTE — Telephone Encounter (Signed)
I have contacted patient to notify him that we have received a referral and that I was calling to schedule him a NP appt.   At this time, patient wants to hold on scheduling the appt because he is scheduled to see his surgeon on Wednesday, Feb 1st and would like to discuss his situation with them first to see what their game plan is. Pt decided he will call our office after that appt to see when he is able to come in.

## 2022-11-13 ENCOUNTER — Other Ambulatory Visit (HOSPITAL_COMMUNITY): Payer: Self-pay

## 2023-12-31 ENCOUNTER — Encounter: Payer: Self-pay | Admitting: Advanced Practice Midwife
